# Patient Record
Sex: Female | Born: 2000 | Race: Black or African American | Hispanic: No | Smoking: Never smoker
Health system: Southern US, Community
[De-identification: ages and names within clinical notes are randomized; demographics above are authoritative.]

## PROBLEM LIST (undated history)

## (undated) DIAGNOSIS — F329 Major depressive disorder, single episode, unspecified: Secondary | ICD-10-CM

## (undated) DIAGNOSIS — G43909 Migraine, unspecified, not intractable, without status migrainosus: Secondary | ICD-10-CM

## (undated) DIAGNOSIS — F32A Depression, unspecified: Secondary | ICD-10-CM

## (undated) DIAGNOSIS — D649 Anemia, unspecified: Secondary | ICD-10-CM

## (undated) DIAGNOSIS — F419 Anxiety disorder, unspecified: Secondary | ICD-10-CM

## (undated) DIAGNOSIS — R7989 Other specified abnormal findings of blood chemistry: Secondary | ICD-10-CM

## (undated) DIAGNOSIS — Z8619 Personal history of other infectious and parasitic diseases: Secondary | ICD-10-CM

## (undated) HISTORY — DX: Other specified abnormal findings of blood chemistry: R79.89

## (undated) HISTORY — DX: Personal history of other infectious and parasitic diseases: Z86.19

## (undated) HISTORY — DX: Anemia, unspecified: D64.9

## (undated) HISTORY — DX: Major depressive disorder, single episode, unspecified: F32.9

## (undated) HISTORY — DX: Anxiety disorder, unspecified: F41.9

## (undated) HISTORY — DX: Depression, unspecified: F32.A

## (undated) HISTORY — DX: Migraine, unspecified, not intractable, without status migrainosus: G43.909

---
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
## 2021-01-26 ENCOUNTER — Other Ambulatory Visit: Payer: Self-pay

## 2021-01-26 ENCOUNTER — Ambulatory Visit: Payer: 59 | Admitting: Nurse Practitioner

## 2021-01-26 VITALS — BP 116/74

## 2021-01-26 DIAGNOSIS — Z3009 Encounter for other general counseling and advice on contraception: Secondary | ICD-10-CM | POA: Diagnosis not present

## 2021-01-26 DIAGNOSIS — Z113 Encounter for screening for infections with a predominantly sexual mode of transmission: Secondary | ICD-10-CM | POA: Diagnosis not present

## 2021-01-26 DIAGNOSIS — N920 Excessive and frequent menstruation with regular cycle: Secondary | ICD-10-CM | POA: Diagnosis not present

## 2021-01-26 LAB — PREGNANCY, URINE: Preg Test, Ur: NEGATIVE

## 2021-01-26 NOTE — Progress Notes (Signed)
   Acute Office Visit  Subjective:    Patient ID: Sandra Campbell, female    DOB: September 28, 2000, 20 y.o.   MRN: [id number]   HPI 20 y.o. presents today for STD screening. H/O chlamydia 03/2020. Same partner since November 2021. No symptoms. She had light spotting a couple of days ago. Takes OCPs continuously but misses pills sometimes.   Review of Systems  Constitutional: Negative.   Genitourinary: Negative.        Objective:    Physical Exam Constitutional:      Appearance: Normal appearance.  Genitourinary:    General: Normal vulva.     Vagina: Normal.     Cervix: Normal.     BP 116/74   LMP 01/23/2021  Wt Readings from Last 3 Encounters:  08/11/20 119 lb (54 kg) (36 %, Z= -0.36)*  07/28/20 122 lb 3.2 oz (55.4 kg) (43 %, Z= -0.18)*  02/18/20 120 lb (54.4 kg) (40 %, Z= -0.25)*   * Growth percentiles are based on CDC (Girls, 2-20 Years) data.   UPT negative     Assessment & Plan:   Problem List Items Addressed This Visit   None   Visit Diagnoses    Screen for STD (sexually transmitted disease)    -  Primary   Relevant Orders   SURESWAB CT/NG/T. vaginalis   Spotting       Relevant Orders   Pregnancy, urine   General counseling and advice on female contraception         Plan: GC/chlamyida/trich pending. She did not want blood work today due to fear of needles. UPT negative. We discussed other contraceptive methods to avoid missed doses. She will think about it and wants to continue OCPs at this time.      Olivia Mackie DNP, 12:21 PM 01/26/2021

## 2021-01-27 LAB — SURESWAB CT/NG/T. VAGINALIS
C. trachomatis RNA, TMA: NOT DETECTED
N. gonorrhoeae RNA, TMA: NOT DETECTED
Trichomonas vaginalis RNA: NOT DETECTED

## 2021-03-18 ENCOUNTER — Telehealth: Payer: Self-pay | Admitting: *Deleted

## 2021-03-18 NOTE — Telephone Encounter (Signed)
Pt called stating she would like to switch birth control due to side effects of depression,mood swings, a sensation of not feeling like her self. Pt states she would like to discuss IUD options. If this option will have the same side effects as the pills. I will route this to her MD for advice/ recommendations.

## 2021-03-18 NOTE — Telephone Encounter (Signed)
Please schedule her for an appointment to discuss options.

## 2021-03-21 NOTE — Telephone Encounter (Signed)
Patient informed, message sent to appointments to schedule.  

## 2021-03-23 ENCOUNTER — Ambulatory Visit: Payer: 59 | Admitting: Nurse Practitioner

## 2021-03-25 ENCOUNTER — Telehealth: Payer: Self-pay | Admitting: *Deleted

## 2021-03-25 DIAGNOSIS — Z3041 Encounter for surveillance of contraceptive pills: Secondary | ICD-10-CM

## 2021-03-25 MED ORDER — JUNEL FE 1/20 1-20 MG-MCG PO TABS: ORAL_TABLET | ORAL | 1 refills | Status: DC

## 2021-03-25 NOTE — Telephone Encounter (Signed)
Patient called stating she takes all active pills for birth control pills, skipping placebo pills,however the Rx is not written this way. She tried to pickup refill on pills and insurance said it was too early and 03/28/21 is the pick up date. Patient asked if a new Rx could be sent with correct directions. Annual exam scheduled in Jan 2023 okay to send Rx with new directions skipping placebo?

## 2021-03-25 NOTE — Telephone Encounter (Signed)
I called patient and said when she saw Tiffany in June she said okay to take pills skipping placbeo pill, patient prefers to take this way. Rx sent.

## 2021-08-30 NOTE — Progress Notes (Deleted)
21 y.o. G0P0000 Single Black or African American Not Hispanic or Latino female here for annual exam.      No LMP recorded.          Sexually active: {yes no:314532}  The current method of family planning is {contraception:315051}.    Exercising: {yes no:314532}  {types:19826} Smoker:  {YES NO:22349}  Health Maintenance: Pap:  none  History of abnormal Pap:  no MMG:  none  BMD:   none  Colonoscopy: none  TDaP:  02/19/13  Gardasil: complete    reports that she has never smoked. She has never used smokeless tobacco. She reports that she does not drink alcohol and does not use drugs.  Past Medical History:  Diagnosis Date   Anxiety    Depression    History of chlamydia    Migraines     No past surgical history on file.  Current Outpatient Medications  Medication Sig Dispense Refill   IBUPROFEN PO Take by mouth.     JUNEL FE 1/20 1-20 MG-MCG tablet Take one active pill daily and skip placebos 84 tablet 1   No current facility-administered medications for this visit.    Family History  Problem Relation Age of Onset   Hypertension Mother    Hypertension Father    Stomach cancer Maternal Aunt    Diabetes Maternal Aunt    Diabetes Maternal Grandmother    Hypertension Maternal Grandmother    Stroke Maternal Grandmother    Heart attack Maternal Grandmother    Hypertension Paternal Grandfather     Review of Systems  Exam:   There were no vitals taken for this visit.  Weight change: @WEIGHTCHANGE @ Height:      Ht Readings from Last 3 Encounters:  08/11/20 5\' 2"  (1.575 m) (19 %, Z= -0.89)*  07/28/20 5\' 1"  (1.549 m) (10 %, Z= -1.28)*  02/18/20 5\' 1"  (1.549 m) (10 %, Z= -1.27)*   * Growth percentiles are based on CDC (Girls, 2-20 Years) data.    General appearance: alert, cooperative and appears stated age Head: Normocephalic, without obvious abnormality, atraumatic Neck: no adenopathy, supple, symmetrical, trachea midline and thyroid {CHL AMB PHY EX THYROID NORM  DEFAULT:[phone]::"normal to inspection and palpation"} Lungs: clear to auscultation bilaterally Cardiovascular: regular rate and rhythm Breasts: {Exam; breast:13139::"normal appearance, no masses or tenderness"} Abdomen: soft, non-tender; non distended,  no masses,  no organomegaly Extremities: extremities normal, atraumatic, no cyanosis or edema Skin: Skin color, texture, turgor normal. No rashes or lesions Lymph nodes: Cervical, supraclavicular, and axillary nodes normal. No abnormal inguinal nodes palpated Neurologic: Grossly normal   Pelvic: External genitalia:  no lesions              Urethra:  normal appearing urethra with no masses, tenderness or lesions              Bartholins and Skenes: normal                 Vagina: normal appearing vagina with normal color and discharge, no lesions              Cervix: {CHL AMB PHY EX CERVIX NORM [phone]::"no lesions"}               Bimanual Exam:  Uterus:  {CHL AMB PHY EX UTERUS NORM DEFAULT:[phone]::"normal size, contour, position, consistency, mobility, non-tender"}              Adnexa: {CHL AMB PHY EX ADNEXA NO MASS DEFAULT:[phone]::"no mass, fullness, tenderness"}  Rectovaginal: Confirms               Anus:  normal sphincter tone, no lesions  *** chaperoned for the exam.  A:  Well Woman with normal exam  P:

## 2021-09-01 ENCOUNTER — Ambulatory Visit: Payer: 59 | Admitting: Obstetrics and Gynecology

## 2021-10-18 ENCOUNTER — Ambulatory Visit (INDEPENDENT_AMBULATORY_CARE_PROVIDER_SITE_OTHER): Payer: 59 | Admitting: Nurse Practitioner

## 2021-10-18 ENCOUNTER — Encounter: Payer: Self-pay | Admitting: Nurse Practitioner

## 2021-10-18 ENCOUNTER — Other Ambulatory Visit: Payer: Self-pay

## 2021-10-18 VITALS — BP 120/76 | Ht 63.0 in | Wt 124.0 lb

## 2021-10-18 DIAGNOSIS — Z3009 Encounter for other general counseling and advice on contraception: Secondary | ICD-10-CM | POA: Diagnosis not present

## 2021-10-18 DIAGNOSIS — Z3041 Encounter for surveillance of contraceptive pills: Secondary | ICD-10-CM | POA: Diagnosis not present

## 2021-10-18 DIAGNOSIS — Z113 Encounter for screening for infections with a predominantly sexual mode of transmission: Secondary | ICD-10-CM | POA: Diagnosis not present

## 2021-10-18 DIAGNOSIS — Z01419 Encounter for gynecological examination (general) (routine) without abnormal findings: Secondary | ICD-10-CM

## 2021-10-18 MED ORDER — NORETHINDRONE 0.35 MG PO TABS: 1.0000 | ORAL_TABLET | Freq: Every day | ORAL | 1 refills | Status: DC

## 2021-10-18 NOTE — Progress Notes (Signed)
° °  Sandra Campbell [birth year] [id number]   History:  21 y.o. G0 presents for annual exam. Monthly cycles. Stopped OCPs due to mood changes. Is interested in switching to different method. Has completed Gardasil series. Would like STD screening today.   Gynecologic History Patient's last menstrual period was 09/16/2021. Period Cycle (Days): 28 Period Duration (Days): 5 Period Pattern: Regular Menstrual Flow: Moderate, Heavy Dysmenorrhea: (!) Severe Dysmenorrhea Symptoms: Cramping Contraception/Family planning: OCP (estrogen/progesterone) Sexually active: Yes  Health Maintenance Last Pap: Not indicated  Last mammogram: Not indicated  Last colonoscopy: Not indicated  Last Dexa: Not indicated   Past medical history, past surgical history, family history and social history were all reviewed and documented in the EPIC chart. In school with plans for nursing school.   ROS:  A ROS was performed and pertinent positives and negatives are included.  Exam:  Vitals:   10/18/21 1530  BP: 120/76  Weight: 124 lb (56.2 kg)  Height: 5\' 3"  (1.6 m)   Body mass index is 21.97 kg/m.  General appearance:  Normal Thyroid:  Symmetrical, normal in size, without palpable masses or nodularity. Respiratory  Auscultation:  Clear without wheezing or rhonchi Cardiovascular  Auscultation:  Regular rate, without rubs, murmurs or gallops  Edema/varicosities:  Not grossly evident Abdominal  Soft,nontender, without masses, guarding or rebound.  Liver/spleen:  No organomegaly noted  Hernia:  None appreciated  Skin  Inspection:  Grossly normal Breasts: Not indicated Genitourinary   Inguinal/mons:  Normal without inguinal adenopathy  External genitalia:  Normal appearing vulva with no masses, tenderness, or lesions  BUS/Urethra/Skene's glands:  Normal  Vagina:  Normal appearing with normal color and discharge, no lesions  Cervix:  Normal appearing without discharge or lesions  Uterus:  Normal in  size, shape and contour.  Midline and mobile, nontender  Adnexa/parametria:     Rt: Normal in size, without masses or tenderness.   Lt: Normal in size, without masses or tenderness.  Anus and perineum: Normal  Digital rectal exam: Not indicated  Patient informed chaperone available to be present for breast and pelvic exam. Patient has requested no chaperone to be present. Patient has been advised what will be completed during breast and pelvic exam.   Assessment/Plan:  21 y.o. G0 for annual exam.   Well female exam with routine gynecological exam - Education provided on SBEs, importance of preventative screenings, current guidelines, high calcium diet, regular exercise, and multivitamin daily.   Encounter for surveillance of contraceptive pills - Plan: norethindrone (ORTHO MICRONOR) 0.35 MG tablet daily. Due for menses in the next couple of days. Will start then. Aware of proper use.   Screen for STD (sexually transmitted disease) - Plan: C. trachomatis/N. gonorrhoeae RNA. Wants to get HIV/RPR done at PCP's office when she goes for other blood work soon.   Encounter for other general counseling or advice on contraception - Contraceptive options were reviewed, including hormonal methods, both combination (pill, patch, vaginal ring) and progesterone-only (pill, Depo Provera and Nexplanon), intrauterine devices (Mirena, [place], [place], and [place]), barrier methods (condoms, diaphragm) and female/female sterilization. The mechanisms, risks, benefits and side effects of all methods were discussed. She is interested in Depo Provera but wants to try progestin-only pills first to see how her moods do with use.   Return in 1 year for annual.     [place] DNP, 3:48 PM 10/18/2021

## 2021-10-19 LAB — C. TRACHOMATIS/N. GONORRHOEAE RNA
C. trachomatis RNA, TMA: NOT DETECTED
N. gonorrhoeae RNA, TMA: NOT DETECTED

## 2022-03-27 ENCOUNTER — Ambulatory Visit: Payer: 59 | Admitting: Nurse Practitioner

## 2022-04-03 ENCOUNTER — Ambulatory Visit: Payer: 59 | Admitting: Nurse Practitioner

## 2022-04-06 ENCOUNTER — Other Ambulatory Visit: Payer: Self-pay | Admitting: Nurse Practitioner

## 2022-04-06 DIAGNOSIS — Z3041 Encounter for surveillance of contraceptive pills: Secondary | ICD-10-CM

## 2022-04-06 NOTE — Telephone Encounter (Signed)
Last annual exam 09/2021 

## 2022-04-11 ENCOUNTER — Ambulatory Visit: Payer: 59 | Admitting: Nurse Practitioner

## 2022-04-11 ENCOUNTER — Encounter: Payer: Self-pay | Admitting: Nurse Practitioner

## 2022-04-11 VITALS — BP 100/58 | HR 100 | Resp 18 | Ht 62.6 in | Wt 120.2 lb

## 2022-04-11 DIAGNOSIS — E559 Vitamin D deficiency, unspecified: Secondary | ICD-10-CM | POA: Diagnosis not present

## 2022-04-11 DIAGNOSIS — Z113 Encounter for screening for infections with a predominantly sexual mode of transmission: Secondary | ICD-10-CM | POA: Diagnosis not present

## 2022-04-11 NOTE — Progress Notes (Unsigned)
   Acute Office Visit  Subjective:    Patient ID: Sandra Campbell, female    DOB: July 28, 2001, 21 y.o.   MRN: [id number]   HPI 21 y.o. presents today for STD screening. On POPs, missed a couple of doses and had menses. Complains of fatigue and feels like she has been this way since she can remember. Always tired, feels like she has restful sleep. Had labs done with PCP - reports normal TSH, CBC, elevated B12, and low Vitamin D. Took high dose Vitamin D x 3 weeks. Would like rechecked today.    Review of Systems  Constitutional:  Positive for fatigue.  Genitourinary: Negative.        Objective:    Physical Exam Constitutional:      Appearance: Normal appearance.  Genitourinary:    General: Normal vulva.     Vagina: Normal.     Cervix: Normal.     BP (!) 100/58 (BP Location: Right Arm)   Pulse 100   Resp 18   Ht 5' 2.6" (1.59 m)   Wt 120 lb 3.2 oz (54.5 kg)   LMP 04/03/2022 (Exact Date)   SpO2 98%   BMI 21.57 kg/m  Wt Readings from Last 3 Encounters:  04/11/22 120 lb 3.2 oz (54.5 kg)  10/18/21 124 lb (56.2 kg)  08/11/20 119 lb (54 kg) (36 %, Z= -0.36)*   * Growth percentiles are based on CDC (Girls, 2-20 Years) data.        Patient informed chaperone available to be present for breast and/or pelvic exam. Patient has requested no chaperone to be present. Patient has been advised what will be completed during breast and pelvic exam.   Assessment & Plan:   Problem List Items Addressed This Visit   None Visit Diagnoses     Screen for STD (sexually transmitted disease)    -  Primary   Relevant Orders   SURESWAB CT/NG/T. vaginalis   RPR   HIV Antibody (routine testing w rflx)   Vitamin D deficiency       Relevant Orders   VITAMIN D 25 Hydroxy (Vit-D Deficiency, Fractures)      Plan: STD panel pending. Discussed importance of taking pills daily to prevent breakthrough bleeding, as well as for pregnancy prevention. Will recheck Vitamin D today. If normal she  plans to follow up with PCP.      Olivia Mackie DNP, 4:11 PM 04/11/2022

## 2022-04-12 LAB — SURESWAB CT/NG/T. VAGINALIS
C. trachomatis RNA, TMA: NOT DETECTED
N. gonorrhoeae RNA, TMA: NOT DETECTED
Trichomonas vaginalis RNA: NOT DETECTED

## 2022-04-12 LAB — HIV ANTIBODY (ROUTINE TESTING W REFLEX): HIV 1&2 Ab, 4th Generation: NONREACTIVE

## 2022-04-12 LAB — VITAMIN D 25 HYDROXY (VIT D DEFICIENCY, FRACTURES): Vit D, 25-Hydroxy: 36 ng/mL (ref 30–100)

## 2022-04-12 LAB — RPR: RPR Ser Ql: NONREACTIVE

## 2022-05-24 ENCOUNTER — Ambulatory Visit: Payer: 59 | Admitting: Nurse Practitioner

## 2022-05-24 VITALS — BP 96/60

## 2022-05-24 DIAGNOSIS — N888 Other specified noninflammatory disorders of cervix uteri: Secondary | ICD-10-CM

## 2022-05-24 DIAGNOSIS — Z113 Encounter for screening for infections with a predominantly sexual mode of transmission: Secondary | ICD-10-CM | POA: Diagnosis not present

## 2022-05-24 DIAGNOSIS — N86 Erosion and ectropion of cervix uteri: Secondary | ICD-10-CM | POA: Diagnosis not present

## 2022-05-24 DIAGNOSIS — N924 Excessive bleeding in the premenopausal period: Secondary | ICD-10-CM | POA: Diagnosis not present

## 2022-05-24 DIAGNOSIS — N930 Postcoital and contact bleeding: Secondary | ICD-10-CM

## 2022-05-24 LAB — PREGNANCY, URINE: Preg Test, Ur: NEGATIVE

## 2022-05-24 NOTE — Progress Notes (Signed)
   Acute Office Visit  Subjective:    Patient ID: Sandra Campbell, female    DOB: 02/09/2001, 21 y.o.   MRN: [id number]   HPI 21 y.o. presents today for bleeding with intercourse 2 nights ago. Bleeding was during intercourse and did not continue after. Bleeding was light. No pain.  Negative STD screening 04/11/22. On POPs. LMP 04/03/22.   Review of Systems  Constitutional: Negative.   Genitourinary:  Positive for vaginal bleeding (With intercourse). Negative for dyspareunia, genital sores, vaginal discharge and vaginal pain.       Objective:    Physical Exam Constitutional:      Appearance: Normal appearance.  Genitourinary:    General: Normal vulva.     Vagina: Normal.     Cervix: Friability (Bleeding with cytobrush) present.     BP 96/60 (BP Location: Left Arm, Patient Position: Sitting, Cuff Size: Normal)   LMP 04/03/2022 (Exact Date) Comment: amenorrhea with Micronor Wt Readings from Last 3 Encounters:  04/11/22 120 lb 3.2 oz (54.5 kg)  10/18/21 124 lb (56.2 kg)  08/11/20 119 lb (54 kg) (36 %, Z= -0.36)*   * Growth percentiles are based on CDC (Girls, 2-20 Years) data.        Patient informed chaperone available to be present for breast and/or pelvic exam. Patient has requested no chaperone to be present. Patient has been advised what will be completed during breast and pelvic exam.   UPT negative  Assessment & Plan:   Problem List Items Addressed This Visit   None Visit Diagnoses     Premenopausal postcoital bleeding    -  Primary   Relevant Orders   Pregnancy, urine   SureSwab Advanced Vaginitis Plus,TMA   Screen for STD (sexually transmitted disease)       Relevant Orders   SureSwab Advanced Vaginitis Plus,TMA   RPR   HIV Antibody (routine testing w rflx)   Cervical ectropion       Friable cervix          Plan: UPT negative. Yeast, BV, and STD panel pending. Friable cervix. Provided option to monitor if no infection present as it may improve on  its own or apply silver nitrate if it continues.      Tamela Gammon DNP, 12:35 PM 05/24/2022

## 2022-05-25 LAB — RPR: RPR Ser Ql: NONREACTIVE

## 2022-05-25 LAB — SURESWAB® ADVANCED VAGINITIS PLUS,TMA
C. trachomatis RNA, TMA: DETECTED — AB
CANDIDA SPECIES: NOT DETECTED
Candida glabrata: NOT DETECTED
N. gonorrhoeae RNA, TMA: NOT DETECTED
SURESWAB(R) ADV BACTERIAL VAGINOSIS(BV),TMA: POSITIVE — AB
TRICHOMONAS VAGINALIS (TV),TMA: NOT DETECTED

## 2022-05-25 LAB — HIV ANTIBODY (ROUTINE TESTING W REFLEX): HIV 1&2 Ab, 4th Generation: NONREACTIVE

## 2022-05-26 ENCOUNTER — Telehealth: Payer: Self-pay

## 2022-05-26 DIAGNOSIS — A749 Chlamydial infection, unspecified: Secondary | ICD-10-CM

## 2022-05-26 DIAGNOSIS — B9689 Other specified bacterial agents as the cause of diseases classified elsewhere: Secondary | ICD-10-CM

## 2022-05-26 MED ORDER — DOXYCYCLINE HYCLATE 100 MG PO TABS: 100.0000 mg | ORAL_TABLET | Freq: Two times a day (BID) | ORAL | 0 refills | Status: AC

## 2022-05-26 MED ORDER — METRONIDAZOLE 0.75 % VA GEL: 1.0000 | Freq: Every day | VAGINAL | 0 refills | Status: AC

## 2022-05-26 NOTE — Telephone Encounter (Signed)
TW pt calling about results from tests ran at visit on 05/24/2022. Please review and respond at your earliest convenience. Thank you

## 2022-05-26 NOTE — Telephone Encounter (Signed)
FYI.  Per ML: "Chlam Pos.  Report.  Condoms.  Inform partner(s).  Treat with Doxycillin 100 mg PO BID x 10 days.  TOC to schedule in about 4-6 weeks.  BV pos.  Treat with Tinidazole (500 mg/tab) 2 tab PO BID x 2 days."   Spoke with pt, she was advised and voiced understanding. She preferred metrogel option for the BV instead of oral. Spoke w/ ML and she reported the gel is ok. Rx sent.   Pt is scheduled for 07/05/22 for TOC.   Report faxed to [facility].

## 2022-06-14 ENCOUNTER — Encounter: Payer: Self-pay | Admitting: Nurse Practitioner

## 2022-06-14 ENCOUNTER — Ambulatory Visit: Payer: 59 | Admitting: Nurse Practitioner

## 2022-06-14 VITALS — BP 102/78 | HR 100

## 2022-06-14 DIAGNOSIS — R300 Dysuria: Secondary | ICD-10-CM

## 2022-06-14 DIAGNOSIS — N898 Other specified noninflammatory disorders of vagina: Secondary | ICD-10-CM

## 2022-06-14 DIAGNOSIS — B3731 Acute candidiasis of vulva and vagina: Secondary | ICD-10-CM | POA: Diagnosis not present

## 2022-06-14 LAB — WET PREP FOR TRICH, YEAST, CLUE

## 2022-06-14 MED ORDER — FLUCONAZOLE 150 MG PO TABS: 150.0000 mg | ORAL_TABLET | ORAL | 0 refills | Status: DC

## 2022-06-14 NOTE — Progress Notes (Signed)
   Acute Office Visit  Subjective:    Patient ID: Sandra Campbell, female    DOB: [birth year], 21 y.o.   MRN: [id number]   HPI 21 y.o. presents today for thick white discharge, mild itching/irritation, and burning with urination. + chlamydia 05/24/22, completed full course, has not been sexually active since.    Review of Systems  Constitutional: Negative.   Genitourinary:  Positive for vaginal discharge and vaginal pain (Itching and irritation).       Objective:    Physical Exam Constitutional:      Appearance: Normal appearance.  Genitourinary:    Vagina: Vaginal discharge and erythema present.     Cervix: Normal.     Comments: Generalized vulvar redness    BP 102/78   Pulse 100   LMP 04/03/2022 (Exact Date) Comment: amenorrhea with Micronor  SpO2 99%  Wt Readings from Last 3 Encounters:  04/11/22 120 lb 3.2 oz (54.5 kg)  10/18/21 124 lb (56.2 kg)  08/11/20 119 lb (54 kg) (36 %, Z= -0.36)*   * Growth percentiles are based on CDC (Girls, 2-20 Years) data.        Patient informed chaperone available to be present for breast and/or pelvic exam. Patient has requested no chaperone to be present. Patient has been advised what will be completed during breast and pelvic exam.   Wet prep + yeast UA  Assessment & Plan:   Problem List Items Addressed This Visit   None Visit Diagnoses     Vaginal candidiasis    -  Primary   Relevant Medications   fluconazole (DIFLUCAN) 150 MG tablet   Dysuria       Relevant Orders   Urinalysis,Complete w/RFL Culture   Vaginal discharge       Relevant Orders   WET PREP FOR Green, YEAST, CLUE      Plan: Wet prep positive for yeast - Diflucan 150 mg today and repeat in 3 days for total of 2 doses. Keep TOC appointment 11/8.      [place], 12:21 PM 06/14/2022

## 2022-06-19 ENCOUNTER — Encounter: Payer: Self-pay | Admitting: Nurse Practitioner

## 2022-06-19 ENCOUNTER — Other Ambulatory Visit: Payer: Self-pay

## 2022-06-19 MED ORDER — AMOXICILLIN-POT CLAVULANATE 500-125 MG PO TABS: 1.0000 | ORAL_TABLET | Freq: Two times a day (BID) | ORAL | 0 refills | Status: DC

## 2022-06-22 LAB — URINE CULTURE
MICRO NUMBER:: 14067519
SPECIMEN QUALITY:: ADEQUATE

## 2022-06-22 LAB — URINALYSIS, COMPLETE W/RFL CULTURE
Bacteria, UA: NONE SEEN /HPF
Bilirubin Urine: NEGATIVE
Casts: NONE SEEN /LPF
Crystals: NONE SEEN /HPF
Glucose, UA: NEGATIVE
Hgb urine dipstick: NEGATIVE
Hyaline Cast: NONE SEEN /LPF
Ketones, ur: NEGATIVE
Leukocyte Esterase: NEGATIVE
Nitrites, Initial: NEGATIVE
Protein, ur: NEGATIVE
RBC / HPF: NONE SEEN /HPF (ref 0–2)
Specific Gravity, Urine: 1.025 (ref 1.001–1.035)
WBC, UA: NONE SEEN /HPF (ref 0–5)
pH: 6 (ref 5.0–8.0)

## 2022-06-22 LAB — CULTURE INDICATED

## 2022-07-05 ENCOUNTER — Encounter: Payer: Self-pay | Admitting: Nurse Practitioner

## 2022-07-05 ENCOUNTER — Ambulatory Visit: Payer: 59 | Admitting: Nurse Practitioner

## 2022-07-05 VITALS — BP 98/70 | HR 93

## 2022-07-05 DIAGNOSIS — A749 Chlamydial infection, unspecified: Secondary | ICD-10-CM | POA: Diagnosis not present

## 2022-07-05 NOTE — Progress Notes (Signed)
   Acute Office Visit  Subjective:    Patient ID: Sandra Campbell, female    DOB: July 22, 2001, 21 y.o.   MRN: [id number]   HPI 21 y.o. presents today for TOC. + Chlamydia 05/24/2022. Completed full course of antibiotics, no intercourse since.    Review of Systems  Constitutional: Negative.   Genitourinary: Negative.        Objective:    Physical Exam Constitutional:      Appearance: Normal appearance.  Genitourinary:    General: Normal vulva.     Vagina: Normal.     Cervix: Normal.     BP 98/70   Pulse 93   LMP 04/03/2022 (Exact Date)   SpO2 98%  Wt Readings from Last 3 Encounters:  04/11/22 120 lb 3.2 oz (54.5 kg)  10/18/21 124 lb (56.2 kg)  08/11/20 119 lb (54 kg) (36 %, Z= -0.36)*   * Growth percentiles are based on CDC (Girls, 2-20 Years) data.        Patient informed chaperone available to be present for breast and/or pelvic exam. Patient has requested no chaperone to be present. Patient has been advised what will be completed during breast and pelvic exam.   Assessment & Plan:   Problem List Items Addressed This Visit   None Visit Diagnoses     Chlamydia infection    -  Primary   Relevant Orders   C. trachomatis/N. gonorrhoeae RNA      Plan: GC/Chlamydia pending.      Olivia Mackie DNP, 12:25 PM 07/05/2022

## 2022-07-06 LAB — C. TRACHOMATIS/N. GONORRHOEAE RNA
C. trachomatis RNA, TMA: NOT DETECTED
N. gonorrhoeae RNA, TMA: NOT DETECTED

## 2022-07-10 ENCOUNTER — Encounter: Payer: Self-pay | Admitting: Nurse Practitioner

## 2022-08-05 ENCOUNTER — Encounter: Payer: Self-pay | Admitting: Nurse Practitioner

## 2022-08-07 ENCOUNTER — Other Ambulatory Visit: Payer: Self-pay | Admitting: Nurse Practitioner

## 2022-08-07 DIAGNOSIS — B3731 Acute candidiasis of vulva and vagina: Secondary | ICD-10-CM

## 2022-08-07 MED ORDER — FLUCONAZOLE 150 MG PO TABS: 150.0000 mg | ORAL_TABLET | ORAL | 0 refills | Status: DC

## 2022-08-18 ENCOUNTER — Encounter: Payer: Self-pay | Admitting: Radiology

## 2022-08-18 ENCOUNTER — Ambulatory Visit: Payer: 59 | Admitting: Radiology

## 2022-08-18 VITALS — BP 124/80 | HR 80

## 2022-08-18 DIAGNOSIS — N760 Acute vaginitis: Secondary | ICD-10-CM

## 2022-08-18 DIAGNOSIS — B379 Candidiasis, unspecified: Secondary | ICD-10-CM

## 2022-08-18 DIAGNOSIS — R300 Dysuria: Secondary | ICD-10-CM | POA: Diagnosis not present

## 2022-08-18 LAB — WET PREP FOR TRICH, YEAST, CLUE

## 2022-08-18 MED ORDER — METRONIDAZOLE 500 MG PO TABS: 500.0000 mg | ORAL_TABLET | Freq: Two times a day (BID) | ORAL | 0 refills | Status: DC

## 2022-08-18 MED ORDER — NYSTATIN-TRIAMCINOLONE 100000-0.1 UNIT/GM-% EX OINT: 1.0000 | TOPICAL_OINTMENT | Freq: Two times a day (BID) | CUTANEOUS | 0 refills | Status: DC

## 2022-08-18 NOTE — Telephone Encounter (Signed)
Call placed to patient.  OV scheduled for today at 1430. Patient will come on into office, aware may be worked in earlier.   Routing to provider for final review. Patient is agreeable to disposition. Will close encounter.   Cc: Elmarie Shiley, NP

## 2022-08-18 NOTE — Progress Notes (Signed)
      Subjective: Sandra Campbell is a 21 y.o. female who complains of vaginal d/c with itching and burning. Was treated for yeast and symptoms did not improve. Not using condoms.   Review of Systems  All other systems reviewed and are negative.   Past Medical History:  Diagnosis Date   Anxiety    Depression    History of chlamydia    Low vitamin D level    Migraines       Objective:  Today's Vitals   08/18/22 1356  BP: 124/80  Pulse: 80   There is no height or weight on file to calculate BMI.   -General: no acute distress -Vulva: without lesions or discharge -Vagina: discharge present, aptima swab and wet prep obtained -Cervix: no lesion or discharge, no CMT -Perineum: no lesions -Uterus: Mobile, non tender -Adnexa: no masses or tenderness   Microscopic wet-mount exam shows clue cells.  Urine dipstick shows positive for RBC's and positive for leukocytes.  Micro exam: 6-10 WBC's per HPF and moderate+ bacteria.   Chaperone offered and declined.  Assessment:/Plan:   1. Vulvovaginitis +BV - nystatin-triamcinolone ointment (MYCOLOG); Apply 1 Application topically 2 (two) times daily.  Dispense: 30 g; Refill: 0 - WET PREP FOR TRICH, YEAST, CLUE - SURESWAB CT/NG/T. vaginalis - metroNIDAZOLE (FLAGYL) 500 MG tablet; Take 1 tablet (500 mg total) by mouth 2 (two) times daily.  Dispense: 14 tablet; Refill: 0  2. Dysuria - Urinalysis,Complete w/RFL Culture    Will contact patient with results of testing completed today. Avoid intercourse until symptoms are resolved. Safe sex encouraged. Avoid the use of soaps or perfumed products in the peri area. Avoid tub baths and sitting in sweaty or wet clothing for prolonged periods of time.

## 2022-08-19 LAB — SURESWAB CT/NG/T. VAGINALIS
C. trachomatis RNA, TMA: NOT DETECTED
N. gonorrhoeae RNA, TMA: NOT DETECTED
Trichomonas vaginalis RNA: NOT DETECTED

## 2022-08-22 LAB — URINALYSIS, COMPLETE W/RFL CULTURE
Bilirubin Urine: NEGATIVE
Casts: NONE SEEN /LPF
Crystals: NONE SEEN /HPF
Glucose, UA: NEGATIVE
Hyaline Cast: NONE SEEN /LPF
Ketones, ur: NEGATIVE
Nitrites, Initial: NEGATIVE
Protein, ur: NEGATIVE
Specific Gravity, Urine: 1.025 (ref 1.001–1.035)
Yeast: NONE SEEN /HPF
pH: 7 (ref 5.0–8.0)

## 2022-08-22 LAB — URINE CULTURE
MICRO NUMBER:: 14350790
SPECIMEN QUALITY:: ADEQUATE

## 2022-08-22 LAB — CULTURE INDICATED

## 2022-08-23 ENCOUNTER — Other Ambulatory Visit: Payer: Self-pay

## 2022-08-23 MED ORDER — SULFAMETHOXAZOLE-TRIMETHOPRIM 800-160 MG PO TABS: 1.0000 | ORAL_TABLET | Freq: Two times a day (BID) | ORAL | 0 refills | Status: DC

## 2022-08-23 MED ORDER — FLUCONAZOLE 150 MG PO TABS: ORAL_TABLET | ORAL | 0 refills | Status: DC

## 2022-08-23 NOTE — Telephone Encounter (Signed)
Ok to send diflucan 150mg  po once may repeat in 3 days

## 2022-08-24 NOTE — Telephone Encounter (Signed)
May send zofran 4mg  #10 to take prn or switch to metrogel to avoid the nausea

## 2022-09-01 ENCOUNTER — Ambulatory Visit: Payer: 59 | Admitting: Nurse Practitioner

## 2022-09-26 ENCOUNTER — Encounter: Payer: Self-pay | Admitting: Nurse Practitioner

## 2022-09-26 DIAGNOSIS — Z3041 Encounter for surveillance of contraceptive pills: Secondary | ICD-10-CM

## 2022-09-26 NOTE — Telephone Encounter (Signed)
Last AEX 10/18/21--nothing scheduled yet for 2024. However, EMR states recall letter was sent. Can inquire if pt would like me to have appt desk contact her to schedule.   Rx pending.

## 2022-09-27 MED ORDER — NORETHINDRONE 0.35 MG PO TABS: ORAL_TABLET | ORAL | 0 refills | Status: DC

## 2022-10-03 ENCOUNTER — Telehealth: Payer: Self-pay

## 2022-10-03 NOTE — Telephone Encounter (Signed)
Second refill request received from [facility] [phone] for norethindrone 0.35mg  #84.  Pharmacy was contacted by phone and VM left that the rf was sent 09/27/22 for quantity #84 (patient needs ov for additional refills).

## 2022-10-13 NOTE — Telephone Encounter (Signed)
10/12/22: Tillie Rung D, CMA Did not want to schedule aex at this time will call back to schedule

## 2022-10-22 ENCOUNTER — Encounter: Payer: Self-pay | Admitting: Nurse Practitioner

## 2022-10-23 ENCOUNTER — Other Ambulatory Visit: Payer: Self-pay | Admitting: Nurse Practitioner

## 2022-10-23 DIAGNOSIS — B3731 Acute candidiasis of vulva and vagina: Secondary | ICD-10-CM

## 2022-10-23 MED ORDER — FLUCONAZOLE 150 MG PO TABS: 150.0000 mg | ORAL_TABLET | ORAL | 0 refills | Status: DC

## 2022-12-05 ENCOUNTER — Ambulatory Visit (INDEPENDENT_AMBULATORY_CARE_PROVIDER_SITE_OTHER): Payer: 59 | Admitting: Nurse Practitioner

## 2022-12-05 ENCOUNTER — Other Ambulatory Visit (HOSPITAL_COMMUNITY)
Admission: RE | Admit: 2022-12-05 | Discharge: 2022-12-05 | Disposition: A | Discharge status: Home / Self Care | Payer: 59 | Source: Ambulatory Visit | Attending: Nurse Practitioner | Admitting: Nurse Practitioner

## 2022-12-05 ENCOUNTER — Encounter: Payer: Self-pay | Admitting: Nurse Practitioner

## 2022-12-05 VITALS — BP 100/62 | HR 79 | Ht 62.5 in | Wt 131.0 lb

## 2022-12-05 DIAGNOSIS — N760 Acute vaginitis: Secondary | ICD-10-CM

## 2022-12-05 DIAGNOSIS — N898 Other specified noninflammatory disorders of vagina: Secondary | ICD-10-CM

## 2022-12-05 DIAGNOSIS — Z01419 Encounter for gynecological examination (general) (routine) without abnormal findings: Secondary | ICD-10-CM

## 2022-12-05 DIAGNOSIS — Z3041 Encounter for surveillance of contraceptive pills: Secondary | ICD-10-CM

## 2022-12-05 DIAGNOSIS — Z124 Encounter for screening for malignant neoplasm of cervix: Secondary | ICD-10-CM | POA: Insufficient documentation

## 2022-12-05 DIAGNOSIS — B9689 Other specified bacterial agents as the cause of diseases classified elsewhere: Secondary | ICD-10-CM

## 2022-12-05 DIAGNOSIS — Z113 Encounter for screening for infections with a predominantly sexual mode of transmission: Secondary | ICD-10-CM | POA: Diagnosis present

## 2022-12-05 LAB — WET PREP FOR TRICH, YEAST, CLUE

## 2022-12-05 MED ORDER — METRONIDAZOLE 0.75 % VA GEL
1.0000 | Freq: Every day | VAGINAL | 0 refills | Status: AC
Start: 2022-12-05 — End: 2022-12-10

## 2022-12-05 MED ORDER — NORETHINDRONE 0.35 MG PO TABS: ORAL_TABLET | ORAL | 3 refills | Status: DC

## 2022-12-05 NOTE — Progress Notes (Signed)
   Sandra Campbell 17-Feb-2001 [id number]   History:  22 y.o. G0 presents for annual exam. Monthly cycles. Switched to POPs due to mood changes with COCs and doing well on these. Feels like she is getting yeast or BV infections with intercourse. Thinks it may be from partner's soap as he uses Ax body wash. Has completed Gardasil series.   Gynecologic History Patient's last menstrual period was 11/28/2022. Period Duration (Days): 4-5 Period Pattern: (!) Irregular Menstrual Flow: Moderate Menstrual Control: Tampon Menstrual Control Change Freq (Hours): 6 Dysmenorrhea: (!) Moderate Dysmenorrhea Symptoms: Cramping, Headache, Diarrhea, Nausea Contraception/Family planning: oral progesterone-only contraceptive Sexually active: Yes  Health Maintenance Last Pap: Never Last mammogram: Not indicated  Last colonoscopy: Not indicated  Last Dexa: Not indicated   Past medical history, past surgical history, family history and social history were all reviewed and documented in the EPIC chart. 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   ROS:  A ROS was performed and pertinent positives and negatives are included.  Exam:  Vitals:   12/05/22 1412  BP: 100/62  Pulse: 79  SpO2: 100%  Weight: 131 lb (59.4 kg)  Height: 5' 2.5" (1.588 m)    Body mass index is 23.58 kg/m.  General appearance:  Normal Thyroid:  Symmetrical, normal in size, without palpable masses or nodularity. Respiratory  Auscultation:  Clear without wheezing or rhonchi Cardiovascular  Auscultation:  Regular rate, without rubs, murmurs or gallops  Edema/varicosities:  Not grossly evident Abdominal  Soft,nontender, without masses, guarding or rebound.  Liver/spleen:  No organomegaly noted  Hernia:  None appreciated  Skin  Inspection:  Grossly normal Breasts: Not indicated per guidelines Genitourinary   Inguinal/mons:  Normal without inguinal adenopathy  External genitalia:  Normal appearing vulva with no  masses, tenderness, or lesions  BUS/Urethra/Skene's glands:  Normal  Vagina:  Normal appearing with normal color and discharge, no lesions  Cervix:  Normal appearing without discharge or lesions  Uterus:  Normal in size, shape and contour.  Midline and mobile, nontender  Adnexa/parametria:     Rt: Normal in size, without masses or tenderness.   Lt: Normal in size, without masses or tenderness.  Anus and perineum: Normal  Digital rectal exam: Not indicated  Patient informed chaperone available to be present for breast and pelvic exam. Patient has requested no chaperone to be present. Patient has been advised what will be completed during breast and pelvic exam.   Wet prep + clue cells (+ odor)  Assessment/Plan:  22 y.o. G0 for annual exam.   Well female exam with routine gynecological exam - Education provided on SBEs, importance of preventative screenings, current guidelines, high calcium diet, regular exercise, and multivitamin daily.   Encounter for surveillance of contraceptive pills - Plan: norethindrone (ORTHO MICRONOR) 0.35 MG tablet daily. Doing well on this. Refill x 1 year provided.   Screening for cervical cancer - Plan: Cytology - PAP( [facility]). Initial pap today.   Vaginal itching - Plan: WET PREP FOR TRICH, YEAST, CLUE. Wet prep + clue cells.   Screening examination for STD (sexually transmitted disease) - Plan: Cytology - PAP( [facility]). GC/CT added to pap.   Bacterial vaginosis - Plan: metroNIDAZOLE (METROGEL) 0.75 % vaginal gel nightly x 5 nights. Recommend partner showering without harsh soaps. Boric acid vaginal suppositories twice weekly.  Return in 1 year for annual.     Olivia Mackie DNP, 3:32 PM 12/05/2022

## 2022-12-08 LAB — CYTOLOGY - PAP
Chlamydia: NEGATIVE
Comment: NEGATIVE
Comment: NORMAL
Diagnosis: NEGATIVE
Neisseria Gonorrhea: NEGATIVE

## 2022-12-17 ENCOUNTER — Other Ambulatory Visit: Payer: Self-pay | Admitting: Nurse Practitioner

## 2022-12-17 DIAGNOSIS — Z3041 Encounter for surveillance of contraceptive pills: Secondary | ICD-10-CM

## 2023-01-17 ENCOUNTER — Ambulatory Visit: Payer: 59 | Admitting: Nurse Practitioner

## 2023-04-13 ENCOUNTER — Encounter: Payer: Self-pay | Admitting: Nurse Practitioner

## 2023-04-13 DIAGNOSIS — N898 Other specified noninflammatory disorders of vagina: Secondary | ICD-10-CM

## 2023-04-13 MED ORDER — FLUCONAZOLE 150 MG PO TABS
150.0000 mg | ORAL_TABLET | Freq: Once | ORAL | 0 refills | Status: AC
Start: 2023-04-13 — End: 2023-04-13

## 2023-04-13 NOTE — Telephone Encounter (Signed)
 Rx sent. Pt notified. Routing to provider for final review and closing.

## 2023-04-13 NOTE — Telephone Encounter (Signed)
Yes, ok to send fluconazole 150mg  po once

## 2023-04-13 NOTE — Telephone Encounter (Signed)
AEX UTD. OK for one dose of diflucan for now and then advise pt to make OV for eval if sxs dont resolve or return soon after?

## 2023-05-03 ENCOUNTER — Ambulatory Visit: Payer: 59 | Admitting: Nurse Practitioner

## 2023-05-03 VITALS — BP 118/63 | HR 78 | Wt 138.4 lb

## 2023-05-03 DIAGNOSIS — D509 Iron deficiency anemia, unspecified: Secondary | ICD-10-CM | POA: Diagnosis not present

## 2023-05-03 DIAGNOSIS — B081 Molluscum contagiosum: Secondary | ICD-10-CM | POA: Diagnosis not present

## 2023-05-03 DIAGNOSIS — R5383 Other fatigue: Secondary | ICD-10-CM | POA: Diagnosis not present

## 2023-05-03 NOTE — Progress Notes (Signed)
   Acute Office Visit  Subjective:    Patient ID: Sandra Campbell, female    DOB: 05-09-2001, 22 y.o.   MRN: [id number]   HPI 22 y.o. presents today for bumps on vulva x 2 weeks. Noticed them after shaving, not painful, no drainage, one is smaller but the other 2 are unchanged. Also complains of daily fatigue. This is not necessarily new for her. Sleeps well. H/O anemia.   No LMP recorded.    Review of Systems  Constitutional:  Positive for fatigue.  Endocrine: Negative for cold intolerance and heat intolerance.  Genitourinary:  Positive for genital sores.       Objective:    Physical Exam Constitutional:      Appearance: Normal appearance.  Genitourinary:      BP 118/63   Pulse 78   Wt 138 lb 6.4 oz (62.8 kg)   SpO2 100%   BMI 24.91 kg/m  Wt Readings from Last 3 Encounters:  05/03/23 138 lb 6.4 oz (62.8 kg)  12/05/22 131 lb (59.4 kg)  04/11/22 120 lb 3.2 oz (54.5 kg)        Patient informed chaperone available to be present for breast and/or pelvic exam. Patient has requested no chaperone to be present. Patient has been advised what will be completed during breast and pelvic exam.   Assessment & Plan:   Problem List Items Addressed This Visit   None Visit Diagnoses     Fatigue, unspecified type    -  Primary   Relevant Orders   Vitamin B12   VITAMIN D 25 Hydroxy (Vit-D Deficiency, Fractures)   TSH   CBC with Differential/Platelet   Comprehensive metabolic panel   Iron deficiency anemia, unspecified iron deficiency anemia type       Relevant Orders   CBC with Differential/Platelet   Molluscum contagiosum       Relevant Orders   Cryotherapy, skin lesion      Plan: Lesions c/w molluscum contagiosum. Option for cryotherapy and she is agreeable. Surrounding skin prepped with petroleum gel, Verruca-freeze applied to each lesion. Patient tolerated well. Educated on after care. Education provided on Molluscum contagiosum. Labs today for evaluation of  fatigue. Seems to be chronic issue. If labs normal, will follow up with PCP.      Olivia Mackie DNP, 4:18 PM 05/03/2023

## 2023-05-04 LAB — COMPREHENSIVE METABOLIC PANEL
AG Ratio: 1.9 (calc) (ref 1.0–2.5)
ALT: 9 U/L (ref 6–29)
AST: 15 U/L (ref 10–30)
Albumin: 4.6 g/dL (ref 3.6–5.1)
Alkaline phosphatase (APISO): 47 U/L (ref 31–125)
BUN: 15 mg/dL (ref 7–25)
CO2: 25 mmol/L (ref 20–32)
Calcium: 10.2 mg/dL (ref 8.6–10.2)
Chloride: 105 mmol/L (ref 98–110)
Creat: 0.79 mg/dL (ref 0.50–0.96)
Globulin: 2.4 g/dL (ref 1.9–3.7)
Glucose, Bld: 87 mg/dL (ref 65–99)
Potassium: 4.2 mmol/L (ref 3.5–5.3)
Sodium: 139 mmol/L (ref 135–146)
Total Bilirubin: 0.4 mg/dL (ref 0.2–1.2)
Total Protein: 7 g/dL (ref 6.1–8.1)

## 2023-05-04 LAB — CBC WITH DIFFERENTIAL/PLATELET
Absolute Monocytes: 837 {cells}/uL (ref 200–950)
Basophils Absolute: 36 {cells}/uL (ref 0–200)
Basophils Relative: 0.4 %
Eosinophils Absolute: 182 {cells}/uL (ref 15–500)
Eosinophils Relative: 2 %
HCT: 40.8 % (ref 35.0–45.0)
Hemoglobin: 13.6 g/dL (ref 11.7–15.5)
Lymphs Abs: 3212 {cells}/uL (ref 850–3900)
MCH: 31.1 pg (ref 27.0–33.0)
MCHC: 33.3 g/dL (ref 32.0–36.0)
MCV: 93.4 fL (ref 80.0–100.0)
MPV: 9.2 fL (ref 7.5–12.5)
Monocytes Relative: 9.2 %
Neutro Abs: 4832 {cells}/uL (ref 1500–7800)
Neutrophils Relative %: 53.1 %
Platelets: 336 10*3/uL (ref 140–400)
RBC: 4.37 10*6/uL (ref 3.80–5.10)
RDW: 12 % (ref 11.0–15.0)
Total Lymphocyte: 35.3 %
WBC: 9.1 10*3/uL (ref 3.8–10.8)

## 2023-05-04 LAB — VITAMIN D 25 HYDROXY (VIT D DEFICIENCY, FRACTURES): Vit D, 25-Hydroxy: 26 ng/mL — ABNORMAL LOW (ref 30–100)

## 2023-05-04 LAB — VITAMIN B12: Vitamin B-12: 810 pg/mL (ref 200–1100)

## 2023-05-04 LAB — TSH: TSH: 1.43 m[IU]/L

## 2023-05-07 ENCOUNTER — Other Ambulatory Visit: Payer: Self-pay | Admitting: Nurse Practitioner

## 2023-05-07 DIAGNOSIS — E559 Vitamin D deficiency, unspecified: Secondary | ICD-10-CM

## 2023-05-07 MED ORDER — VITAMIN D (ERGOCALCIFEROL) 1.25 MG (50000 UNIT) PO CAPS
50000.0000 [IU] | ORAL_CAPSULE | ORAL | 0 refills | Status: DC
Start: 2023-05-07 — End: 2023-05-14

## 2023-05-11 ENCOUNTER — Encounter: Payer: Self-pay | Admitting: Nurse Practitioner

## 2023-05-11 DIAGNOSIS — E559 Vitamin D deficiency, unspecified: Secondary | ICD-10-CM

## 2023-05-14 MED ORDER — VITAMIN D (ERGOCALCIFEROL) 1.25 MG (50000 UNIT) PO CAPS
50000.0000 [IU] | ORAL_CAPSULE | ORAL | 0 refills | Status: AC
Start: 2023-05-14 — End: 2023-07-03

## 2023-05-14 NOTE — Telephone Encounter (Signed)
Routing to provider for final review.

## 2023-07-03 ENCOUNTER — Encounter: Payer: Self-pay | Admitting: Nurse Practitioner

## 2023-07-03 ENCOUNTER — Other Ambulatory Visit: Payer: Self-pay | Admitting: Nurse Practitioner

## 2023-07-03 DIAGNOSIS — B3731 Acute candidiasis of vulva and vagina: Secondary | ICD-10-CM

## 2023-07-03 MED ORDER — FLUCONAZOLE 150 MG PO TABS
150.0000 mg | ORAL_TABLET | ORAL | 0 refills | Status: DC
Start: 2023-07-03 — End: 2024-01-16

## 2023-08-30 ENCOUNTER — Telehealth: Payer: Self-pay | Admitting: *Deleted

## 2023-08-30 NOTE — Telephone Encounter (Signed)
 Patient left message requesting return call. On OCP, reports irregular menses, is considering coming off or changing birth control. Reports menses q 2 weeks.

## 2023-08-30 NOTE — Telephone Encounter (Signed)
 LVMTCB.  Spoke w/ the pt and she reports that the menses 11/17-11/21, 12/1-12/6, 12/16-12/20. Pt denies pain/discomfort out of the norm. Pt reports missing a couple tabs in 06/2023. However, has been taking consistently since. Pt advised that could be what is contributing to the irregular bleeding and her body is still trying to regulate. Pt advised that rx needs to be taking ~51months consistently to find its balance.  Pt stated still having daily fatigue. However, did mention completing the 8 week dose of Vit D but has only been taking Vit C OTC since rx ended.   Please advise on anything additional. Thanks.

## 2023-09-03 NOTE — Telephone Encounter (Signed)
 If irregular bleeding continues,  OV recommended. If fatigue persists we can also recheck Vit D since she is not taking an OTC supplement.

## 2023-09-04 NOTE — Telephone Encounter (Signed)
 Spoke with patient, advised per Tiffany. Patient declines to schedule OV or AEX at this time, will continue to monitor and return call if irregular bleeding or fatigue continues.  Routing to provider for final review. Patient is agreeable to disposition. Will close encounter.

## 2023-09-05 ENCOUNTER — Other Ambulatory Visit: Payer: Self-pay | Admitting: Nurse Practitioner

## 2023-09-05 DIAGNOSIS — Z3041 Encounter for surveillance of contraceptive pills: Secondary | ICD-10-CM

## 2023-09-06 NOTE — Telephone Encounter (Signed)
 Pt also LVM in med refill line inquiring about BCP refill.

## 2023-09-06 NOTE — Telephone Encounter (Signed)
 Med refill request:Norethindrone 0.35 mg tab Last AEX: 12/05/22 -TW Next AEX: Not scheduled  Last MMG (if hormonal med) N/A Refill authorized: Please Advise?  Rx pended #28/2RF

## 2024-01-16 ENCOUNTER — Ambulatory Visit (INDEPENDENT_AMBULATORY_CARE_PROVIDER_SITE_OTHER): Admitting: Nurse Practitioner

## 2024-01-16 ENCOUNTER — Encounter: Payer: Self-pay | Admitting: Nurse Practitioner

## 2024-01-16 VITALS — BP 96/56 | Ht 62.0 in | Wt 137.6 lb

## 2024-01-16 DIAGNOSIS — Z3041 Encounter for surveillance of contraceptive pills: Secondary | ICD-10-CM

## 2024-01-16 DIAGNOSIS — N760 Acute vaginitis: Secondary | ICD-10-CM | POA: Diagnosis not present

## 2024-01-16 DIAGNOSIS — Z1331 Encounter for screening for depression: Secondary | ICD-10-CM

## 2024-01-16 DIAGNOSIS — Z01419 Encounter for gynecological examination (general) (routine) without abnormal findings: Secondary | ICD-10-CM

## 2024-01-16 MED ORDER — NORETHINDRONE 0.35 MG PO TABS: ORAL_TABLET | ORAL | 3 refills | Status: DC

## 2024-01-16 MED ORDER — FLUCONAZOLE 150 MG PO TABS: 150.0000 mg | ORAL_TABLET | ORAL | 0 refills | Status: DC

## 2024-01-16 NOTE — Progress Notes (Signed)
 Lavilla Crossley 2000-09-25 [id number]   History:  23 y.o. G0 presents for annual exam. Monthly cycles. Wants to restart birth control. Was doing well on POPs. Normal pap history. Has completed Gardasil series. H/O recurrent yeast.   Gynecologic History Patient's last menstrual period was 12/24/2023 (exact date). Period Cycle (Days): 28 Period Duration (Days): 5 Period Pattern: Regular Menstrual Flow: Light, Moderate, Heavy (off OCPs heavy) Menstrual Control: Thin pad, Maxi pad Dysmenorrhea: (!) Severe Dysmenorrhea Symptoms: Cramping Contraception/Family planning: oral progesterone-only contraceptive Sexually active: Yes  Health Maintenance Last Pap: 12/05/2022. Results were: Normal Last mammogram: Not indicated  Last colonoscopy: Not indicated  Last Dexa: Not indicated      01/16/2024   12:08 PM  Depression screen PHQ 2/9  Decreased Interest 2  Down, Depressed, Hopeless 1  PHQ - 2 Score 3  Altered sleeping 0  Tired, decreased energy 2  Change in appetite 2  Feeling bad or failure about yourself  1  Trouble concentrating 3  Moving slowly or fidgety/restless 2  Suicidal thoughts 0  PHQ-9 Score 13  Difficult doing work/chores Somewhat difficult     Past medical history, past surgical history, family history and social history were all reviewed and documented in the EPIC chart. Boyfriend. Has 7 mo doodle. 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   ROS:  A ROS was performed and pertinent positives and negatives are included.  Exam:  Vitals:   01/16/24 1207  BP: (!) 96/56  Weight: 137 lb 9.6 oz (62.4 kg)  Height: 5\' 2"  (1.575 m)     Body mass index is 25.17 kg/m.  General appearance:  Normal Thyroid:  Symmetrical, normal in size, without palpable masses or nodularity. Respiratory  Auscultation:  Clear without wheezing or rhonchi Cardiovascular  Auscultation:  Regular rate, without rubs, murmurs or  gallops  Edema/varicosities:  Not grossly evident Abdominal  Soft,nontender, without masses, guarding or rebound.  Liver/spleen:  No organomegaly noted  Hernia:  None appreciated  Skin  Inspection:  Grossly normal Breasts: Not indicated per guidelines Pelvic: External genitalia:  no lesions              Urethra:  normal appearing urethra with no masses, tenderness or lesions              Bartholins and Skenes: normal                 Vagina: Chunky, white discharge present              Cervix: no lesions Bimanual Exam:  Uterus:  no masses or tenderness              Adnexa: no mass, fullness, tenderness              Rectovaginal: Deferred              Anus:  normal, no lesions  Patient informed chaperone available to be present for breast and pelvic exam. Patient has requested no chaperone to be present. Patient has been advised what will be completed during breast and pelvic exam.   Assessment/Plan:  23 y.o. G0 for annual exam.   Well female exam with routine gynecological exam - Education provided on SBEs, importance of preventative screenings, current guidelines, high calcium  diet, regular exercise, and multivitamin daily.   Encounter for surveillance of contraceptive pills - Plan: norethindrone  (MICRONOR ) 0.35 MG tablet daily. Will start with first day of next menses.   Acute vaginitis -  Plan: fluconazole  (DIFLUCAN ) 150 MG tablet every 3 days x 2 doses.   Positive screening for depression on 9-item Patient Health Questionnaire (PHQ-9) - score of 13. Reports some anxiety and days of sadness. Did therapy as a teenager. Unsure if it is menstrual related but will monitor. Denies SI.   Screening for cervical cancer - Normal pap history. Will repeat at 3-year interval per guidelines.   Return in about 1 year (around 01/15/2025) for Annual.    Andee Bamberger DNP, 12:22 PM 01/16/2024

## 2024-02-06 ENCOUNTER — Other Ambulatory Visit: Payer: Self-pay

## 2024-02-06 ENCOUNTER — Emergency Department (HOSPITAL_COMMUNITY)
Admission: EM | Admit: 2024-02-06 | Discharge: 2024-02-06 | Disposition: A | Discharge status: Home / Self Care | Attending: Emergency Medicine | Admitting: Emergency Medicine

## 2024-02-06 ENCOUNTER — Encounter (HOSPITAL_COMMUNITY): Payer: Self-pay | Admitting: Emergency Medicine

## 2024-02-06 DIAGNOSIS — Y93K1 Activity, walking an animal: Secondary | ICD-10-CM | POA: Insufficient documentation

## 2024-02-06 DIAGNOSIS — S91211A Laceration without foreign body of right great toe with damage to nail, initial encounter: Secondary | ICD-10-CM | POA: Insufficient documentation

## 2024-02-06 DIAGNOSIS — S91209A Unspecified open wound of unspecified toe(s) with damage to nail, initial encounter: Secondary | ICD-10-CM

## 2024-02-06 DIAGNOSIS — W2209XA Striking against other stationary object, initial encounter: Secondary | ICD-10-CM | POA: Insufficient documentation

## 2024-02-06 MED ORDER — LIDOCAINE HCL (PF) 1 % IJ SOLN
5.0000 mL | Freq: Once | INTRAMUSCULAR | Status: AC
  Administered 2024-02-06: 5 mL
  Filled 2024-02-06: qty 5

## 2024-02-06 MED ORDER — CEPHALEXIN 500 MG PO CAPS: 500.0000 mg | ORAL_CAPSULE | Freq: Three times a day (TID) | ORAL | 0 refills | Status: DC

## 2024-02-06 NOTE — Discharge Instructions (Signed)
 It was a pleasure taking part in your care. Please keep splint in place to top of toe to help nail adhere. Please follow up with your PCP. Begin taking keflex three times a day for 5 days. Return to ED with any new or worsening symptoms.

## 2024-02-06 NOTE — ED Provider Notes (Signed)
 Nail Removal  Date/Time: 02/06/2024 5:57 AM  Performed by: Onetha Bile, MD Authorized by: Onetha Bile, MD   Consent:    Consent obtained:  Verbal   Consent given by:  Patient   Risks, benefits, and alternatives were discussed: yes     Risks discussed:  Permanent nail deformity, pain, infection, incomplete removal and bleeding   Alternatives discussed:  No treatment Universal protocol:    Patient identity confirmed:  Verbally with patient Pre-procedure details:    Skin preparation:  Alcohol Nail Removal:    Nail removed:  Complete   Nail bed repaired: yes     Nail bed repair material:  Tissue adhesive Post-procedure details:    Dressing:  4x4 sterile gauze and splint   Procedure completion:  Robbin Chill, MD 02/06/24 [phone]

## 2024-02-06 NOTE — ED Triage Notes (Signed)
 Pt c/o right big toe pain, toenail is partially lifted up.
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
## 2024-02-08 ENCOUNTER — Encounter: Payer: Self-pay | Admitting: Podiatry

## 2024-02-08 ENCOUNTER — Ambulatory Visit: Admitting: Podiatry

## 2024-02-08 DIAGNOSIS — L603 Nail dystrophy: Secondary | ICD-10-CM | POA: Diagnosis not present

## 2024-02-08 NOTE — Progress Notes (Unsigned)
 Subjective:  Patient ID: Sandra Campbell, female    DOB: 12-04-2000,  MRN: [id number]  Chief Complaint  Patient presents with   Nail Problem    Right hallux nail  Pt stated that she went to the hospital on 6/11 because she hit her nail she stated that they took it off but the Dr put it back on.     23 y.o. female presents with the above complaint.  Patient presents with right hallux nail dystrophy.  Patient had a trauma to the right great toe after she kicked a cement block at 4 AM.  She states she went to the emergency room where they took the nail off but reattached the nail.  She states that it hurts has not gotten any better she went to urgent care on 611.  Would like to discuss next treatment plan.   Review of Systems: Negative except as noted in the HPI. Denies N/V/F/Ch.  Past Medical History:  Diagnosis Date   Anemia    Anxiety    Depression    History of chlamydia    Low vitamin D  level    Migraines     Current Outpatient Medications:    cephALEXin  (KEFLEX ) 500 MG capsule, Take 1 capsule (500 mg total) by mouth 3 (three) times daily., Disp: 15 capsule, Rfl: 0   fluconazole  (DIFLUCAN ) 150 MG tablet, Take 1 tablet (150 mg total) by mouth every 3 (three) days., Disp: 2 tablet, Rfl: 0   IBUPROFEN  PO, Take by mouth., Disp: , Rfl:    norethindrone  (MICRONOR ) 0.35 MG tablet, TAKE 1 TABLET(0.35 MG) BY MOUTH DAILY, Disp: 84 tablet, Rfl: 3   OVER THE COUNTER MEDICATION, Elm bark, vitamin c, Disp: , Rfl:    Probiotic Product (PROBIOTIC PO), Take by mouth., Disp: , Rfl:   Social History   Tobacco Use  Smoking Status Never   Passive exposure: Never  Smokeless Tobacco Never    No Known Allergies Objective:  There were no vitals filed for this visit. There is no height or weight on file to calculate BMI. Constitutional Well developed. Well nourished.  Vascular Dorsalis pedis pulses palpable bilaterally. Posterior tibial pulses palpable bilaterally. Capillary refill normal  to all digits.  No cyanosis or clubbing noted. Pedal hair growth normal.  Neurologic Normal speech. Oriented to person, place, and time. Epicritic sensation to light touch grossly present bilaterally.  Dermatologic Pain on palpation of the entire/total nail on 1st digit of the right No other open wounds. No skin lesions.  Orthopedic: Normal joint ROM without pain or crepitus bilaterally. No visible deformities. No bony tenderness.   Radiographs: None Assessment:   1. Nail dystrophy    Plan:  Patient was evaluated and treated and all questions answered.  Nail contusion/dystrophy hallux, right -Patient elects to proceed with minor surgery to remove entire toenail today. Consent reviewed and signed by patient. -Entire/total nail excised. See procedure note. -Educated on post-procedure care including soaking. Written instructions provided and reviewed. -Patient to follow up in 2 weeks for nail check.  Procedure: Excision of entire/total nail  Location: Right 1st toe digit Anesthesia: Lidocaine  1% plain; 1.5 mL and Marcaine 0.5% plain; 1.5 mL, digital block. Skin Prep: Betadine. Dressing: Silvadene; telfa; dry, sterile, compression dressing. Technique: Following skin prep, the toe was exsanguinated and a tourniquet was secured at the base of the toe. The affected nail border was freed and excised. The tourniquet was then removed and sterile dressing applied. Disposition: Patient tolerated procedure well. Patient to return in  2 weeks for follow-up.   No follow-ups on file.

## 2024-02-08 NOTE — Patient Instructions (Signed)

## 2024-09-04 ENCOUNTER — Ambulatory Visit: Admitting: Nurse Practitioner

## 2024-09-04 ENCOUNTER — Encounter: Payer: Self-pay | Admitting: Nurse Practitioner

## 2024-09-04 VITALS — BP 118/64

## 2024-09-04 DIAGNOSIS — Z3009 Encounter for other general counseling and advice on contraception: Secondary | ICD-10-CM

## 2024-09-04 DIAGNOSIS — N926 Irregular menstruation, unspecified: Secondary | ICD-10-CM

## 2024-09-04 DIAGNOSIS — N898 Other specified noninflammatory disorders of vagina: Secondary | ICD-10-CM

## 2024-09-04 DIAGNOSIS — Z113 Encounter for screening for infections with a predominantly sexual mode of transmission: Secondary | ICD-10-CM

## 2024-09-04 LAB — WET PREP FOR TRICH, YEAST, CLUE

## 2024-09-04 LAB — PREGNANCY, URINE: Preg Test, Ur: NEGATIVE

## 2024-09-04 NOTE — Progress Notes (Signed)
" ° °  Acute Office Visit  Subjective:    Patient ID: Sandra Campbell, female    DOB: 2000-09-01, 24 y.o.   MRN: [id number]   HPI 24 y.o. presents today for STD testing. Vaginal irritation x 3 days. New partner. Takes OCPs but does forget them sometimes. Menses are late.   Patient's last menstrual period was 07/21/2024 (exact date).    Review of Systems  Constitutional: Negative.   Genitourinary:  Positive for vaginal pain (Irritation). Negative for vaginal discharge.       Objective:    Physical Exam Exam conducted with a chaperone present.  Constitutional:      Appearance: Normal appearance.  Genitourinary:    General: Normal vulva.     BP 118/64 (BP Location: Left Arm, Patient Position: Sitting)   LMP 07/21/2024 (Exact Date)  Wt Readings from Last 3 Encounters:  02/06/24 137 lb 9.1 oz (62.4 kg)  01/16/24 137 lb 9.6 oz (62.4 kg)  05/03/23 138 lb 6.4 oz (62.8 kg)        Sandra Campbell, CMA present as chaperone.   Wet prep negative for pathogens  UPT neg  Assessment & Plan:   Problem List Items Addressed This Visit   None Visit Diagnoses       Missed period    -  Primary   Relevant Orders   Pregnancy, urine     Screening examination for STD (sexually transmitted disease)       Relevant Orders   C. trachomatis/N. gonorrhoeae RNA   RPR W/RFLX TO RPR TITER, TREPONEMAL AB, SCREEN AND DIAGNOSIS   HIV Antibody (routine testing w rflx)     Vaginal irritation       Relevant Orders   WET PREP FOR TRICH, YEAST, CLUE     General counseling and advice on female contraception          Plan: Negative wet prep. STD panel pending. UPT neg. Discussed all contraceptive options. Will consider patch.   Return if symptoms worsen or fail to improve.    Sandra DELENA Shutter DNP, 2:51 PM 09/04/2024 "

## 2024-09-05 ENCOUNTER — Ambulatory Visit: Admitting: Nurse Practitioner

## 2024-09-05 LAB — C. TRACHOMATIS/N. GONORRHOEAE RNA
C. trachomatis RNA, TMA: NOT DETECTED
N. gonorrhoeae RNA, TMA: NOT DETECTED

## 2024-09-05 LAB — HIV ANTIBODY (ROUTINE TESTING W REFLEX)
HIV 1&2 Ab, 4th Generation: NONREACTIVE
HIV FINAL INTERPRETATION: NEGATIVE

## 2024-09-05 LAB — SYPHILIS: RPR W/REFLEX TO RPR TITER AND TREPONEMAL ANTIBODIES, TRADITIONAL SCREENING AND DIAGNOSIS ALGORITHM: RPR Ser Ql: NONREACTIVE

## 2024-09-08 ENCOUNTER — Ambulatory Visit: Payer: Self-pay | Admitting: Nurse Practitioner

## 2024-09-10 ENCOUNTER — Other Ambulatory Visit: Payer: Self-pay | Admitting: Radiology

## 2024-09-10 DIAGNOSIS — Z3041 Encounter for surveillance of contraceptive pills: Secondary | ICD-10-CM

## 2024-09-10 NOTE — Telephone Encounter (Signed)
 Med refill request:   Norethindrone  (MICRONOR ) 0.35 MG tablet  Start:  01/16/24 Disp:  84 tablets Refills:  3  Last AEX:  01/16/24 Next AEX:  Not yet scheduled Last MMG (if hormonal med):  N/A Refill authorized? Please Advise.
